# Patient Record
Sex: Male | Born: 1980 | Race: Black or African American | Hispanic: No | Marital: Single | Smoking: Current every day smoker
Health system: Southern US, Community
[De-identification: ages and names within clinical notes are randomized; demographics above are authoritative.]

## PROBLEM LIST (undated history)

## (undated) DIAGNOSIS — J45909 Unspecified asthma, uncomplicated: Secondary | ICD-10-CM

---

## 2007-06-11 ENCOUNTER — Emergency Department (HOSPITAL_COMMUNITY): Admission: EM | Admit: 2007-06-11 | Discharge: 2007-06-11 | Payer: Self-pay | Admitting: Emergency Medicine

## 2007-09-24 ENCOUNTER — Emergency Department (HOSPITAL_COMMUNITY): Admission: EM | Admit: 2007-09-24 | Discharge: 2007-09-24 | Payer: Self-pay | Admitting: Emergency Medicine

## 2008-04-29 ENCOUNTER — Emergency Department (HOSPITAL_COMMUNITY): Admission: EM | Admit: 2008-04-29 | Discharge: 2008-04-29 | Payer: Self-pay | Admitting: Family Medicine

## 2009-02-24 ENCOUNTER — Emergency Department (HOSPITAL_COMMUNITY): Admission: EM | Admit: 2009-02-24 | Discharge: 2009-02-24 | Payer: Self-pay | Admitting: Emergency Medicine

## 2009-04-28 ENCOUNTER — Emergency Department (HOSPITAL_COMMUNITY): Admission: EM | Admit: 2009-04-28 | Discharge: 2009-04-28 | Payer: Self-pay | Admitting: Emergency Medicine

## 2010-05-14 LAB — DIFFERENTIAL
Basophils Relative: 1 % (ref 0–1)
Eosinophils Absolute: 0.1 10*3/uL (ref 0.0–0.7)
Eosinophils Relative: 2 % (ref 0–5)
Lymphs Abs: 2 10*3/uL (ref 0.7–4.0)
Monocytes Absolute: 0.8 10*3/uL (ref 0.1–1.0)
Monocytes Relative: 9 % (ref 3–12)
Neutrophils Relative %: 67 % (ref 43–77)

## 2010-05-14 LAB — POCT I-STAT, CHEM 8
Creatinine, Ser: 0.9 mg/dL (ref 0.4–1.5)
HCT: 48 % (ref 39.0–52.0)
Hemoglobin: 16.3 g/dL (ref 13.0–17.0)
Potassium: 3.4 mEq/L — ABNORMAL LOW (ref 3.5–5.1)
Sodium: 140 mEq/L (ref 135–145)

## 2010-05-14 LAB — BASIC METABOLIC PANEL
Calcium: 9.5 mg/dL (ref 8.4–10.5)
GFR calc Af Amer: >60 mL/min (ref >60)
GFR calc non Af Amer: >60 mL/min (ref >60)
Glucose, Bld: 92 mg/dL (ref 70–99)
Sodium: 140 mEq/L (ref 135–145)

## 2010-05-14 LAB — URINALYSIS, ROUTINE W REFLEX MICROSCOPIC
Ketones, ur: NEGATIVE mg/dL
Protein, ur: NEGATIVE mg/dL
Specific Gravity, Urine: 1.023 (ref 1.005–1.030)
Urobilinogen, UA: 0.2 mg/dL (ref 0.0–1.0)

## 2010-05-14 LAB — RAPID URINE DRUG SCREEN, HOSP PERFORMED
Barbiturates: UNDETERMINED
Cocaine: POSITIVE — AB
Tetrahydrocannabinol: POSITIVE — AB

## 2010-05-14 LAB — CBC
MCHC: 33.9 g/dL (ref 30.0–36.0)
RDW: 13.4 % (ref 11.5–15.5)

## 2010-06-01 LAB — GC/CHLAMYDIA PROBE AMP, GENITAL: GC Probe Amp, Genital: NEGATIVE

## 2010-11-17 LAB — URINE CULTURE

## 2010-11-17 LAB — URINALYSIS, ROUTINE W REFLEX MICROSCOPIC
Bilirubin Urine: NEGATIVE
Ketones, ur: NEGATIVE
Nitrite: NEGATIVE
Protein, ur: NEGATIVE
pH: 6.5

## 2010-11-17 LAB — GC/CHLAMYDIA PROBE AMP, GENITAL
Chlamydia, DNA Probe: POSITIVE — AB
GC Probe Amp, Genital: NEGATIVE

## 2010-11-17 LAB — URINE MICROSCOPIC-ADD ON

## 2012-03-02 ENCOUNTER — Emergency Department (HOSPITAL_COMMUNITY)
Admission: EM | Admit: 2012-03-02 | Discharge: 2012-03-03 | Disposition: A | Discharge status: Home / Self Care | Payer: Self-pay | Attending: Emergency Medicine | Admitting: Emergency Medicine

## 2012-03-02 ENCOUNTER — Encounter (HOSPITAL_COMMUNITY): Payer: Self-pay

## 2012-03-02 DIAGNOSIS — L02519 Cutaneous abscess of unspecified hand: Secondary | ICD-10-CM | POA: Insufficient documentation

## 2012-03-02 DIAGNOSIS — R21 Rash and other nonspecific skin eruption: Secondary | ICD-10-CM | POA: Insufficient documentation

## 2012-03-02 DIAGNOSIS — J45909 Unspecified asthma, uncomplicated: Secondary | ICD-10-CM | POA: Insufficient documentation

## 2012-03-02 DIAGNOSIS — L0291 Cutaneous abscess, unspecified: Secondary | ICD-10-CM

## 2012-03-02 DIAGNOSIS — F17200 Nicotine dependence, unspecified, uncomplicated: Secondary | ICD-10-CM | POA: Insufficient documentation

## 2012-03-02 DIAGNOSIS — L03019 Cellulitis of unspecified finger: Secondary | ICD-10-CM | POA: Insufficient documentation

## 2012-03-02 HISTORY — DX: Unspecified asthma, uncomplicated: J45.909

## 2012-03-02 NOTE — ED Notes (Signed)
Pt reports an abscess to (L) ring finger x10 days. Redness and swelling noted to knuckle, denies injury

## 2012-03-03 MED ORDER — SULFAMETHOXAZOLE-TMP DS 800-160 MG PO TABS: 1.0000 | ORAL_TABLET | Freq: Two times a day (BID) | ORAL | Status: DC

## 2012-03-03 MED ORDER — OXYCODONE-ACETAMINOPHEN 5-325 MG PO TABS: ORAL_TABLET | ORAL | Status: DC
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
## 2012-03-04 NOTE — ED Provider Notes (Signed)
Medical screening examination/treatment/procedure(s) were performed by non-physician practitioner and as supervising physician I was immediately available for consultation/collaboration.   Suzi Roots, MD 03/04/12 [phone]

## 2017-02-06 ENCOUNTER — Emergency Department (HOSPITAL_COMMUNITY): Payer: Self-pay

## 2017-02-06 ENCOUNTER — Encounter (HOSPITAL_COMMUNITY): Payer: Self-pay | Admitting: *Deleted

## 2017-02-06 ENCOUNTER — Other Ambulatory Visit: Payer: Self-pay

## 2017-02-06 ENCOUNTER — Emergency Department (HOSPITAL_COMMUNITY)
Admission: EM | Admit: 2017-02-06 | Discharge: 2017-02-06 | Disposition: A | Discharge status: Home / Self Care | Payer: Self-pay | Attending: Emergency Medicine | Admitting: Emergency Medicine

## 2017-02-06 DIAGNOSIS — W228XXA Striking against or struck by other objects, initial encounter: Secondary | ICD-10-CM | POA: Insufficient documentation

## 2017-02-06 DIAGNOSIS — Y999 Unspecified external cause status: Secondary | ICD-10-CM | POA: Insufficient documentation

## 2017-02-06 DIAGNOSIS — W503XXA Accidental bite by another person, initial encounter: Secondary | ICD-10-CM

## 2017-02-06 DIAGNOSIS — Y9389 Activity, other specified: Secondary | ICD-10-CM | POA: Insufficient documentation

## 2017-02-06 DIAGNOSIS — F17200 Nicotine dependence, unspecified, uncomplicated: Secondary | ICD-10-CM | POA: Insufficient documentation

## 2017-02-06 DIAGNOSIS — Y929 Unspecified place or not applicable: Secondary | ICD-10-CM | POA: Insufficient documentation

## 2017-02-06 DIAGNOSIS — S61452A Open bite of left hand, initial encounter: Secondary | ICD-10-CM | POA: Insufficient documentation

## 2017-02-06 DIAGNOSIS — Z79899 Other long term (current) drug therapy: Secondary | ICD-10-CM | POA: Insufficient documentation

## 2017-02-06 DIAGNOSIS — Z23 Encounter for immunization: Secondary | ICD-10-CM | POA: Insufficient documentation

## 2017-02-06 DIAGNOSIS — S63055A Dislocation of other carpometacarpal joint of left hand, initial encounter: Secondary | ICD-10-CM | POA: Insufficient documentation

## 2017-02-06 DIAGNOSIS — J45909 Unspecified asthma, uncomplicated: Secondary | ICD-10-CM | POA: Insufficient documentation

## 2017-02-06 MED ORDER — MELOXICAM 15 MG PO TABS: 15.0000 mg | ORAL_TABLET | Freq: Every day | ORAL | 0 refills | Status: DC

## 2017-02-06 MED ORDER — AMOXICILLIN-POT CLAVULANATE 875-125 MG PO TABS: 1.0000 | ORAL_TABLET | Freq: Two times a day (BID) | ORAL | 0 refills | Status: DC

## 2017-02-06 MED ORDER — OXYCODONE-ACETAMINOPHEN 5-325 MG PO TABS
1.0000 | ORAL_TABLET | ORAL | Status: DC | PRN
  Administered 2017-02-06: 1 via ORAL
  Filled 2017-02-06: qty 1

## 2017-02-06 MED ORDER — LIDOCAINE HCL (PF) 1 % IJ SOLN
5.0000 mL | Freq: Once | INTRAMUSCULAR | Status: AC
  Administered 2017-02-06: 5 mL via INTRADERMAL
  Filled 2017-02-06: qty 5

## 2017-02-06 MED ORDER — TETANUS-DIPHTH-ACELL PERTUSSIS 5-2.5-18.5 LF-MCG/0.5 IM SUSP
0.5000 mL | Freq: Once | INTRAMUSCULAR | Status: AC
  Administered 2017-02-06: 0.5 mL via INTRAMUSCULAR
  Filled 2017-02-06: qty 0.5

## 2017-02-06 NOTE — ED Notes (Signed)
Ice applied left hand 

## 2017-02-06 NOTE — ED Notes (Signed)
Ortho tech at bedsdide 
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
## 2017-02-06 NOTE — ED Notes (Signed)
Patient transported to X-ray 

## 2017-02-06 NOTE — ED Notes (Signed)
Pt staets he understands instructions. Home stable with steady gait.

## 2017-02-06 NOTE — ED Notes (Signed)
Wound at left 1st finger cleaned and dressed with bacitracin and guaze.

## 2017-02-06 NOTE — Discharge Instructions (Addendum)
The splint should stay in place for 4 -6 weeks. Please follow up Dr Victorino DikeHewitt. Contact a health care provider if: You have chills. You have pain when you move your injured area. You have trouble moving your injured area. You are not improving, or you are getting worse. Get help right away if: You have increasing fluid, blood, or pus coming from your wound. You have increasing redness, swelling, or pain at the site of your wound. You have a red streak extending away from your wound. You have a fever.

## 2017-02-06 NOTE — Progress Notes (Signed)
Orthopedic Tech Progress Note Patient Details:  Roy Nichols 1980-11-20 [id number]  Ortho Devices Type of Ortho Device: Ace wrap, Arm sling, Ulna gutter splint Ortho Device/Splint Location: lue Ortho Device/Splint Interventions: Application   Post Interventions Patient Tolerated: Well Instructions Provided: Care of device   Nikki DomCrawford, Maybelle Depaoli 02/06/2017, 1:15 PM
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
## 2017-05-18 ENCOUNTER — Emergency Department (HOSPITAL_COMMUNITY)
Admission: EM | Admit: 2017-05-18 | Discharge: 2017-05-18 | Disposition: A | Discharge status: Home / Self Care | Payer: Self-pay | Attending: Emergency Medicine | Admitting: Emergency Medicine

## 2017-05-18 DIAGNOSIS — Y999 Unspecified external cause status: Secondary | ICD-10-CM | POA: Insufficient documentation

## 2017-05-18 DIAGNOSIS — Z5321 Procedure and treatment not carried out due to patient leaving prior to being seen by health care provider: Secondary | ICD-10-CM | POA: Insufficient documentation

## 2017-05-18 DIAGNOSIS — W268XXA Contact with other sharp object(s), not elsewhere classified, initial encounter: Secondary | ICD-10-CM | POA: Insufficient documentation

## 2017-05-18 DIAGNOSIS — Y929 Unspecified place or not applicable: Secondary | ICD-10-CM | POA: Insufficient documentation

## 2017-05-18 DIAGNOSIS — S21112A Laceration without foreign body of left front wall of thorax without penetration into thoracic cavity, initial encounter: Secondary | ICD-10-CM | POA: Insufficient documentation

## 2017-05-18 DIAGNOSIS — Y9389 Activity, other specified: Secondary | ICD-10-CM | POA: Insufficient documentation

## 2017-05-18 NOTE — ED Triage Notes (Signed)
Pt to ER for evaluation of left axilla/lateral left thoracic superficial laceration with box cutter, per patient he accidentally did it when he picked up a bag and it had a box cutter sticking through it, it cut him. Pt extremely lethargic at this time. Pt denies using ETOH or drugs at this time. Pulse ox 98%. Pupils +4 equal and reactive. States he has not had much sleep this week.

## 2017-05-18 NOTE — ED Triage Notes (Signed)
[place] x 2 in  [place]

## 2017-05-18 NOTE — ED Notes (Signed)
Pt walked up to counter to inform that he was leaving.

## 2017-06-06 ENCOUNTER — Other Ambulatory Visit: Payer: Self-pay

## 2017-06-06 ENCOUNTER — Emergency Department (HOSPITAL_COMMUNITY)
Admission: EM | Admit: 2017-06-06 | Discharge: 2017-06-07 | Disposition: A | Discharge status: Home / Self Care | Payer: Self-pay | Attending: Emergency Medicine | Admitting: Emergency Medicine

## 2017-06-06 ENCOUNTER — Encounter (HOSPITAL_COMMUNITY): Payer: Self-pay

## 2017-06-06 DIAGNOSIS — F10920 Alcohol use, unspecified with intoxication, uncomplicated: Secondary | ICD-10-CM

## 2017-06-06 DIAGNOSIS — Z046 Encounter for general psychiatric examination, requested by authority: Secondary | ICD-10-CM

## 2017-06-06 DIAGNOSIS — F1220 Cannabis dependence, uncomplicated: Secondary | ICD-10-CM | POA: Insufficient documentation

## 2017-06-06 DIAGNOSIS — F4310 Post-traumatic stress disorder, unspecified: Secondary | ICD-10-CM | POA: Diagnosis present

## 2017-06-06 DIAGNOSIS — Z79899 Other long term (current) drug therapy: Secondary | ICD-10-CM | POA: Insufficient documentation

## 2017-06-06 DIAGNOSIS — J45909 Unspecified asthma, uncomplicated: Secondary | ICD-10-CM | POA: Insufficient documentation

## 2017-06-06 DIAGNOSIS — F332 Major depressive disorder, recurrent severe without psychotic features: Secondary | ICD-10-CM | POA: Insufficient documentation

## 2017-06-06 DIAGNOSIS — R4589 Other symptoms and signs involving emotional state: Secondary | ICD-10-CM | POA: Insufficient documentation

## 2017-06-06 DIAGNOSIS — F17200 Nicotine dependence, unspecified, uncomplicated: Secondary | ICD-10-CM | POA: Insufficient documentation

## 2017-06-06 DIAGNOSIS — F1010 Alcohol abuse, uncomplicated: Secondary | ICD-10-CM | POA: Insufficient documentation

## 2017-06-06 LAB — RAPID URINE DRUG SCREEN, HOSP PERFORMED
Amphetamines: NOT DETECTED
BARBITURATES: NOT DETECTED
Benzodiazepines: NOT DETECTED
Cocaine: NOT DETECTED
OPIATES: NOT DETECTED
TETRAHYDROCANNABINOL: POSITIVE — AB

## 2017-06-06 LAB — BASIC METABOLIC PANEL
ANION GAP: 15 (ref 5–15)
BUN: 11 mg/dL (ref 6–20)
CALCIUM: 9.5 mg/dL (ref 8.9–10.3)
CHLORIDE: 105 mmol/L (ref 101–111)
CO2: 20 mmol/L — AB (ref 22–32)
Creatinine, Ser: 1.09 mg/dL (ref 0.61–1.24)
GFR calc non Af Amer: >60 mL/min (ref >60)
Glucose, Bld: 86 mg/dL (ref 65–99)
POTASSIUM: 3.8 mmol/L (ref 3.5–5.1)
Sodium: 140 mmol/L (ref 135–145)

## 2017-06-06 LAB — ETHANOL: Alcohol, Ethyl (B): 185 mg/dL — ABNORMAL HIGH (ref <10)

## 2017-06-06 LAB — CBC WITH DIFFERENTIAL/PLATELET
BASOS PCT: 0 %
Basophils Absolute: 0.1 10*3/uL (ref 0.0–0.1)
Eosinophils Absolute: 0.4 10*3/uL (ref 0.0–0.7)
Eosinophils Relative: 2 %
HEMATOCRIT: 47 % (ref 39.0–52.0)
HEMOGLOBIN: 16.7 g/dL (ref 13.0–17.0)
LYMPHS PCT: 35 %
Lymphs Abs: 5.2 10*3/uL (ref 0.7–4.0)
MCH: 32.2 pg (ref 26.0–34.0)
MCHC: 35.5 g/dL (ref 30.0–36.0)
MCV: 90.7 fL (ref 78.0–100.0)
Monocytes Absolute: 0.9 10*3/uL (ref 0.1–1.0)
Monocytes Relative: 6 %
NEUTROS ABS: 8.3 10*3/uL (ref 1.7–7.7)
NEUTROS PCT: 57 %
Platelets: 294 10*3/uL (ref 150–400)
RBC: 5.18 MIL/uL (ref 4.22–5.81)
RDW: 13 % (ref 11.5–15.5)
WBC: 14.8 10*3/uL — ABNORMAL HIGH (ref 4.0–10.5)

## 2017-06-06 MED ORDER — ONDANSETRON 4 MG PO TBDP: 4.0000 mg | ORAL_TABLET | Freq: Four times a day (QID) | ORAL | Status: DC | PRN

## 2017-06-06 MED ORDER — LOPERAMIDE HCL 2 MG PO CAPS: 2.0000 mg | ORAL_CAPSULE | ORAL | Status: DC | PRN

## 2017-06-06 MED ORDER — STERILE WATER FOR INJECTION IJ SOLN
INTRAMUSCULAR | Status: AC
  Administered 2017-06-06: 10 mL
  Filled 2017-06-06: qty 10

## 2017-06-06 MED ORDER — LORAZEPAM 1 MG PO TABS: 1.0000 mg | ORAL_TABLET | Freq: Four times a day (QID) | ORAL | Status: DC | PRN

## 2017-06-06 MED ORDER — HYDROXYZINE HCL 25 MG PO TABS
25.0000 mg | ORAL_TABLET | Freq: Four times a day (QID) | ORAL | Status: DC | PRN
  Filled 2017-06-06: qty 1

## 2017-06-06 MED ORDER — CARBAMAZEPINE ER 200 MG PO TB12
200.0000 mg | ORAL_TABLET | Freq: Two times a day (BID) | ORAL | Status: DC
  Filled 2017-06-06: qty 1

## 2017-06-06 MED ORDER — ADULT MULTIVITAMIN W/MINERALS CH: 1.0000 | ORAL_TABLET | Freq: Every day | ORAL | Status: DC

## 2017-06-06 MED ORDER — VITAMIN B-1 100 MG PO TABS: 100.0000 mg | ORAL_TABLET | Freq: Every day | ORAL | Status: DC

## 2017-06-06 MED ORDER — ZIPRASIDONE MESYLATE 20 MG IM SOLR
20.0000 mg | Freq: Once | INTRAMUSCULAR | Status: AC
  Administered 2017-06-06: 20 mg via INTRAMUSCULAR
  Filled 2017-06-06: qty 20

## 2017-06-06 MED ORDER — THIAMINE HCL 100 MG/ML IJ SOLN: 100.0000 mg | Freq: Once | INTRAMUSCULAR | Status: DC

## 2017-06-06 NOTE — ED Notes (Signed)
Patient denies pain and is resting comfortably.  

## 2017-06-06 NOTE — BH Assessment (Signed)
[facility]  Per Juanetta BeetsJacqueline Norman, DO, this pt [facility] at this [facility] for further observation and stabilization.  Pt presents under IVC initiated by pt's mother, which Dr Sharma CovertNorman has upheld.  Pt's nurse has been notified.   Doylene Canninghomas Torris House, [place] Behavioral Health Coordinator [phone]

## 2017-06-06 NOTE — ED Notes (Signed)
[facility] Expected date:  Expected time:  Means of arrival:  Comments: Room 16

## 2017-06-06 NOTE — ED Notes (Signed)
Patient seen sleeping soundly at this time. Will continue to monitor patient.
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
## 2017-06-06 NOTE — BH Assessment (Signed)
Assessment Note  Roy Nichols is an 37 y.o. male.  -Clinician reviewed note by Sharilyn Sites, PA.  37 year old male with history of asthma, presenting to the ED under IVC petition by his mother.  Reportedly patient was threatening her with a knife and losing control from a mental standpoint.  Patient adamantly denies these accusations.  He reports he and his mother got into a verbal altercation over something stupid.  States he left the house for several hours, came back and was trying to go to bed when the police showed up at his door to bring him in.  States he is not exactly sure what happened.  He denies any suicidal homicidal ideation.  No hallucinations.  He is calm and cooperative here.  Admits to smoking weed and drinking a beer earlier this evening.  Patient is agitated about being IVC'ed and brought to [facility].  He admits that he and mother had gotten into an argument.  He says that it was over a very minor thing.  He reports that he left the house for two hours and visited with his pastor and "got a lot of things off my chest."  He says came back and got ready for bed and the police showed up and brought him to [facility].  Patient denies doing anything violent towards his mother.  He denies any SI, HI or A/V hallucinations.  Patient is stressed about working enough to help pay for his 69 year old son's birthday party this Sunday.  Patient says he works but does not get enough work to make ends meet.  He has legal issues currently and is on probation also.    Patient admits to using marijuana daily.   He drinks ETOH about every other day or so.    Patient is frustrated about being in this situation.  He says he is hungry and is stressed about not having enough money.  He said "I need help with getting a better job and being in my own home."  He sees the IVC as a waste of time.  He is worried about missing work in the morning.  Clinician called mother and talked to her.  She said that he has  been charging towards her.  She says that it starts when he is intoxicated.  She says "it is like a temper tantrum."  She said that this behavior happened last week also.  He can also get this way when he is not intoxicated also.  She said that it was bad last week and she had to call the police because he was becoming physically aggressive.  He was trying to get her car keys from her.  Patient was seeing a therapist when he was living in [place] 2-3 years ago.  He witnessed a friend get killed in front of him.  Mother feels he needs psychiatric help to get him control of his emotions.    -Clinician discussed patient care with Donell Sievert, PA.  He recommends patient's IVC be reviewed and 1st Opinion completed.  Diagnosis: F12.20 Cannabis use d/o severe; F10.20 ETOH use d/o moderate; F33.2 MDD recurrent severe  Past Medical History:  Past Medical History:  Diagnosis Date  . Asthma     History reviewed. No pertinent surgical history.  Family History: History reviewed. No pertinent family history.  Social History:  reports that he has been smoking.  He has never used smokeless tobacco. He reports that he drinks alcohol. He reports that he has current  or past drug history. Drug: Marijuana.  Additional Social History:  Alcohol / Drug Use Pain Medications: None Prescriptions: None Over the Counter: None History of alcohol / drug use?: Yes Substance #1 Name of Substance 1: ETOH 1 - Age of First Use: Teens 1 - Amount (size/oz): A beer every few days 1 - Frequency: 2-3x/W 1 - Duration: ongoing 1 - Last Use / Amount: 04/17  BAL was 185 at 02:11 Substance #2 Name of Substance 2: Marijuana 2 - Age of First Use: Teens 2 - Amount (size/oz): "As much as I can get my hands on." 2 - Frequency: Daily 2 - Duration: on-going  2 - Last Use / Amount: 04/17  CIWA: CIWA-Ar BP: 116/60 Pulse Rate: 79 COWS:    Allergies:  Allergies  Allergen Reactions  . Amoxicillin Other (See Comments)     unknown  . Other     Grape and cranberry juice  . Shellfish Allergy     Home Medications:  (Not in a hospital admission)  OB/GYN Status:  No LMP for male patient.  General Assessment Data Location of Assessment: [facility] TTS Assessment: In system Is this a Tele or Face-to-Face Assessment?: Face-to-Face Is this an Initial Assessment or a Re-assessment for this encounter?: Initial Assessment Marital status: Single Is patient pregnant?: No Pregnancy Status: No Living Arrangements: Parent(Lives with mother) Can pt return to current living arrangement?: Yes Admission Status: Involuntary Is patient capable of signing voluntary admission?: No Referral Source: Self/Family/Friend Insurance type: self pay     Crisis Care Plan Living Arrangements: Parent(Lives with mother) Name of Psychiatrist: None Name of Therapist: None  Education Status Is patient currently in school?: No Is the patient employed, unemployed or receiving disability?: Employed(Pt says he works for himself)  Risk to self with the past 6 months Suicidal Ideation: No Has patient been a risk to self within the past 6 months prior to admission? : No Suicidal Intent: No Has patient had any suicidal intent within the past 6 months prior to admission? : No Is patient at risk for suicide?: No Suicidal Plan?: No Has patient had any suicidal plan within the past 6 months prior to admission? : No Access to Means: No What has been your use of drugs/alcohol within the last 12 months?: ETOH & cocaine Previous Attempts/Gestures: No How many times?: 0 Other Self Harm Risks: None Triggers for Past Attempts: None known Intentional Self Injurious Behavior: None Family Suicide History: No Recent stressful life event(s): Conflict (Comment), Financial Problems, Legal Issues(Conflict with mother) Persecutory voices/beliefs?: No Depression: Yes Depression Symptoms: Despondent, Guilt, Feeling angry/irritable Substance abuse  history and/or treatment for substance abuse?: Yes Suicide prevention information given to non-admitted patients: Not applicable  Risk to Others within the past 6 months Homicidal Ideation: No Does patient have any lifetime risk of violence toward others beyond the six months prior to admission? : No Thoughts of Harm to Others: No Current Homicidal Intent: No Current Homicidal Plan: No Access to Homicidal Means: No Identified Victim: No one History of harm to others?: Yes Assessment of Violence: In distant past Violent Behavior Description: When I was in school. Does patient have access to weapons?: No Criminal Charges Pending?: Yes Describe Pending Criminal Charges: Does not want to divulge Does patient have a court date: Yes Court Date: (Does not divulge.) Is patient on probation?: Yes  Psychosis Hallucinations: None noted Delusions: None noted  Mental Status Report Appearance/Hygiene: Unremarkable, In scrubs Eye Contact: Poor Motor Activity: Freedom of movement, Unremarkable Speech:  Logical/coherent Level of Consciousness: Alert, Irritable Mood: Anxious, Angry Affect: Irritable Anxiety Level: Moderate Thought Processes: Relevant, Coherent Judgement: Impaired Orientation: Appropriate for developmental age Obsessive Compulsive Thoughts/Behaviors: None  Cognitive Functioning Concentration: Normal Memory: Remote Intact, Recent Intact Is patient IDD: No Is patient DD?: No Insight: Good Impulse Control: Poor Appetite: Good Have you had any weight changes? : No Change Sleep: No Change Total Hours of Sleep: 8 Vegetative Symptoms: None  ADLScreening [facility] Assessment Services) Patient's cognitive ability adequate to safely complete daily activities?: Yes Patient able to express need for assistance with ADLs?: Yes Independently performs ADLs?: Yes (appropriate for developmental age)  Prior Inpatient Therapy Prior Inpatient Therapy: No  Prior Outpatient  Therapy Prior Outpatient Therapy: No Does patient have an ACCT team?: No Does patient have Intensive In-House Services?  : No Does patient have [facility] services? : No Does patient have P4CC services?: No  ADL Screening (condition at time of admission) Patient's cognitive ability adequate to safely complete daily activities?: Yes Is the patient deaf or have difficulty hearing?: No Does the patient have difficulty seeing, even when wearing glasses/contacts?: No Does the patient have difficulty concentrating, remembering, or making decisions?: No Patient able to express need for assistance with ADLs?: Yes Does the patient have difficulty dressing or bathing?: No Independently performs ADLs?: Yes (appropriate for developmental age) Does the patient have difficulty walking or climbing stairs?: No Weakness of Legs: None Weakness of Arms/Hands: None       Abuse/Neglect Assessment (Assessment to be complete while patient is alone) Abuse/Neglect Assessment Can Be Completed: Yes Physical Abuse: Denies Verbal Abuse: Denies Sexual Abuse: Denies Exploitation of patient/patient's resources: Denies Self-Neglect: Denies     Merchant navy officer (For Healthcare) Does Patient Have a Medical Advance Directive?: No Would patient like information on creating a medical advance directive?: No - Patient declined    Additional Information 1:1 In Past 12 Months?: No CIRT Risk: No Elopement Risk: No Does patient have medical clearance?: Yes     Disposition:  Disposition Initial Assessment Completed for this Encounter: Yes Disposition of Patient: (Pt to be reviewed with PA) Patient refused recommended treatment: No Mode of transportation if patient is discharged?: N/A Patient referred to: Other (Comment)(To be reviewed with PA)  On Site Evaluation by:   Reviewed with Physician:    Alexandria Lodge 06/06/2017 6:07 AM

## 2017-06-06 NOTE — ED Notes (Signed)
Patient refused his bedtime med stated "I'm fine. Nothing is wrong with me". Patient denies pain, SI/HI, AH/VH at this time. Patient believes he's here for "nothing". Will continue to monitor patient.

## 2017-06-06 NOTE — ED Triage Notes (Signed)
Pt here under IVC by his mother, [employer] was at the house earlier because he was threatening with a knife and losing control

## 2017-06-07 DIAGNOSIS — F4310 Post-traumatic stress disorder, unspecified: Secondary | ICD-10-CM

## 2017-06-07 DIAGNOSIS — Z62820 Parent-biological child conflict: Secondary | ICD-10-CM

## 2017-06-07 DIAGNOSIS — F1290 Cannabis use, unspecified, uncomplicated: Secondary | ICD-10-CM

## 2017-06-07 DIAGNOSIS — R454 Irritability and anger: Secondary | ICD-10-CM

## 2017-06-07 DIAGNOSIS — F17210 Nicotine dependence, cigarettes, uncomplicated: Secondary | ICD-10-CM

## 2017-06-07 DIAGNOSIS — F1010 Alcohol abuse, uncomplicated: Secondary | ICD-10-CM

## 2017-06-07 MED ORDER — CARBAMAZEPINE ER 200 MG PO TB12: 200.0000 mg | ORAL_TABLET | Freq: Two times a day (BID) | ORAL | 0 refills | Status: AC

## 2017-06-07 NOTE — Consult Note (Addendum)
[place] [facility] Consult   Reason for Consult:  Altercation with his mother, anger issues Referring Physician:  EDP Patient Identification: Roy Nichols MRN:  [id number] Principal Diagnosis: PTSD (post-traumatic stress disorder) Diagnosis:   Patient Active Problem List   Diagnosis Date Noted  . PTSD (post-traumatic stress disorder) [F43.10] 06/06/2017    Priority: High  . Alcohol abuse [F10.10] 06/06/2017    Priority: High    Total Time spent with patient: 45 minutes  Subjective:   Roy Nichols is a 37 y.o. male patient has stabilized.  HPI:  37 yo male who was IVC'd by his mother after a verbal altercation after drinking.  He denies suicidal/homicidal ideations, hallucinations, and withdrawal symptoms.  His mother would like him to get help for his anger due to his PTSD from seeing a friend of his killed a few years ago.  He was initially agitated when he first came but has been calm and cooperative for 24 hours.  Diezel did talk to his mother and knows he cannot return to live with her but does have a place to live.  He, unfortunately, does not want to get counseling or take medications.  Zidan just wants to continue taking his "herbs."  Past Psychiatric History: anger issues, substance abuse  Risk to Self: None Risk to [facility] Prior Inpatient Therapy: Prior Inpatient Therapy: No Prior Outpatient Therapy: Prior Outpatient Therapy: No Does patient have an ACCT team?: No Does patient have Intensive In-House Services?  : No Does patient have [facility] services? : No Does patient have P4CC services?: No  Past Medical History:  Past Medical History:  Diagnosis Date  . Asthma    History reviewed. No pertinent surgical history. Family History: History reviewed. No pertinent family history. Family Psychiatric  History: none Social History:  Social History   Substance and Sexual Activity  Alcohol Use Yes     Social History   Substance and Sexual  Activity  Drug Use Yes  . Types: Marijuana    Social History   Socioeconomic History  . Marital status: Single    Spouse name: Not on file  . Number of children: Not on file  . Years of education: Not on file  . Highest education level: Not on file  Occupational History  . Not on file  Social Needs  . Financial resource strain: Not on file  . Food insecurity:    Worry: Not on file    Inability: Not on file  . Transportation needs:    Medical: Not on file    Non-medical: Not on file  Tobacco Use  . Smoking status: Current Every Day Smoker  . Smokeless tobacco: Never Used  Substance and Sexual Activity  . Alcohol use: Yes  . Drug use: Yes    Types: Marijuana  . Sexual activity: Not on file  Lifestyle  . Physical activity:    Days per week: Not on file    Minutes per session: Not on file  . Stress: Not on file  Relationships  . Social connections:    Talks on phone: Not on file    Gets together: Not on file    Attends religious service: Not on file    Active member of club or organization: Not on file    Attends meetings of clubs or organizations: Not on file    Relationship status: Not on file  Other Topics Concern  . Not on file  Social History Narrative  . Not on file  Additional Social History: N/A    Allergies:   Allergies  Allergen Reactions  . Amoxicillin Other (See Comments)    unknown  . Other     Grape and cranberry juice  . Shellfish Allergy     Labs:  Results for orders placed or performed during the hospital encounter of 06/06/17 (from the past 48 hour(s))  Rapid urine drug screen (hospital performed)     Status: Abnormal   Collection Time: 06/06/17  2:11 AM  Result Value Ref Range   Opiates NONE DETECTED NONE DETECTED   Cocaine NONE DETECTED NONE DETECTED   Benzodiazepines NONE DETECTED NONE DETECTED   Amphetamines NONE DETECTED NONE DETECTED   Tetrahydrocannabinol POSITIVE (A) NONE DETECTED   Barbiturates NONE DETECTED NONE DETECTED     Comment: (NOTE) DRUG SCREEN FOR MEDICAL PURPOSES ONLY.  IF CONFIRMATION IS NEEDED FOR ANY PURPOSE, NOTIFY LAB WITHIN 5 DAYS. LOWEST DETECTABLE LIMITS FOR URINE DRUG SCREEN Drug Class                     Cutoff (ng/mL) Amphetamine and metabolites    1000 Barbiturate and metabolites    200 Benzodiazepine                 373 Tricyclics and metabolites     300 Opiates and metabolites        300 Cocaine and metabolites        300 THC                            50 Performed at [facility], [place] [address], [place], [place] [address]   CBC with Differential     Status: Abnormal   Collection Time: 06/06/17  2:11 AM  Result Value Ref Range   WBC 14.8 (H) 4.0 - 10.5 K/uL   RBC 5.18 4.22 - 5.81 MIL/uL   Hemoglobin 16.7 13.0 - 17.0 g/dL   HCT 47.0 39.0 - 52.0 %   MCV 90.7 78.0 - 100.0 fL   MCH 32.2 26.0 - 34.0 pg   MCHC 35.5 30.0 - 36.0 g/dL   RDW 13.0 11.5 - 15.5 %   Platelets 294 150 - 400 K/uL   Neutrophils Relative % 57 %   Neutro Abs 8.3 1.7 - 7.7 K/uL   Lymphocytes Relative 35 %   Lymphs Abs 5.2 0.7 - 4.0 K/uL   Monocytes Relative 6 %   Monocytes Absolute 0.9 0.1 - 1.0 K/uL   Eosinophils Relative 2 %   Eosinophils Absolute 0.4 0.0 - 0.7 K/uL   Basophils Relative 0 %   Basophils Absolute 0.1 0.0 - 0.1 K/uL    Comment: Performed at [facility], [place] [address], [place], [place] [address]  Basic metabolic panel     Status: Abnormal   Collection Time: 06/06/17  2:11 AM  Result Value Ref Range   Sodium 140 135 - 145 mmol/L   Potassium 3.8 3.5 - 5.1 mmol/L   Chloride 105 101 - 111 mmol/L   CO2 20 (L) 22 - 32 mmol/L   Glucose, Bld 86 65 - 99 mg/dL   BUN 11 6 - 20 mg/dL   Creatinine, Ser 1.09 0.61 - 1.24 mg/dL   Calcium 9.5 8.9 - 10.3 mg/dL   GFR calc non Af Amer >60 >60 mL/min   GFR calc Af Amer >60 >60 mL/min    Comment: (NOTE) The eGFR has been calculated using  the CKD EPI equation. This calculation has not been validated in all  clinical situations. eGFR's persistently <60 mL/min signify possible Chronic Kidney Disease.    Anion gap 15 5 - 15    Comment: Performed at [facility], [place] [address], [place], [place] [address]  Ethanol     Status: Abnormal   Collection Time: 06/06/17  2:11 AM  Result Value Ref Range   Alcohol, Ethyl (B) 185 (H) <10 mg/dL    Comment:        LOWEST DETECTABLE LIMIT FOR SERUM ALCOHOL IS 10 mg/dL FOR MEDICAL PURPOSES ONLY Performed at [facility] [address] [place], [place] [address]     Current Facility-Administered Medications  Medication Dose Route Frequency Provider Last Rate Last Dose  . carbamazepine (TEGRETOL XR) 12 hr tablet 200 mg  200 mg Oral BID Patrecia Pour, NP      . hydrOXYzine (ATARAX/VISTARIL) tablet 25 mg  25 mg Oral Q6H PRN Faythe Dingwall, DO      . loperamide (IMODIUM) capsule 2-4 mg  2-4 mg Oral PRN Faythe Dingwall, DO      . multivitamin with minerals tablet 1 tablet  1 tablet Oral Daily Faythe Dingwall, DO      . ondansetron (ZOFRAN-ODT) disintegrating tablet 4 mg  4 mg Oral Q6H PRN Faythe Dingwall, DO      . thiamine (VITAMIN B-1) tablet 100 mg  100 mg Oral Daily Faythe Dingwall, DO       Current Outpatient Medications  Medication Sig Dispense Refill  . amoxicillin-clavulanate (AUGMENTIN) 875-125 MG tablet Take 1 tablet by mouth 2 (two) times daily. One po bid x 7 days (Patient not taking: Reported on 06/06/2017) 14 tablet 0  . meloxicam (MOBIC) 15 MG tablet Take 1 tablet (15 mg total) by mouth daily. Take 1 daily with food. (Patient not taking: Reported on 06/06/2017) 10 tablet 0  . oxyCODONE-acetaminophen (PERCOCET/ROXICET) 5-325 MG per tablet 1 to 2 tabs PO q6hrs  PRN for pain (Patient not taking: Reported on 06/06/2017) 15 tablet 0  . sulfamethoxazole-trimethoprim (BACTRIM DS) 800-160 MG per tablet Take 1 tablet by mouth 2 (two) times daily. (Patient not taking: Reported on 06/06/2017) 14  tablet 0    Musculoskeletal: Strength & Muscle Tone: within normal limits Gait & Station: normal Patient leans: N/A  Psychiatric Specialty Exam: Physical Exam  Nursing note and vitals reviewed. Constitutional: He is oriented to person, place, and time. He appears well-developed and well-nourished.  HENT:  Head: Normocephalic and atraumatic.  Neck: Normal range of motion.  Respiratory: Effort normal.  Musculoskeletal: Normal range of motion.  Neurological: He is alert and oriented to person, place, and time.  Psychiatric: He has a normal mood and affect. His speech is normal and behavior is normal. Judgment and thought content normal. Cognition and memory are normal.    Review of Systems  Psychiatric/Behavioral: Positive for substance abuse.  All other systems reviewed and are negative.   Blood pressure 118/76, pulse 67, temperature 98.1 F (36.7 C), temperature source Oral, resp. rate 16, SpO2 100 %.There is no height or weight on file to calculate BMI.  General Appearance: Casual  Eye Contact:  Good  Speech:  Normal Rate  Volume:  Normal  Mood:  Euthymic  Affect:  Congruent  Thought Process:  Coherent and Descriptions of Associations: Intact  Orientation:  Full (Time, Place, and Person)  Thought Content:  WDL and Logical  Suicidal Thoughts:  No  Homicidal Thoughts:  No  Memory:  Immediate;   Good Recent;   Good Remote;   Good  Judgement:  Fair  Insight:  Fair  Psychomotor Activity:  Normal  Concentration:  Concentration: Good and Attention Span: Good  Recall:  Good  Fund of Knowledge:  Good  Language:  Good  Akathisia:  No  Handed:  Right  AIMS (if indicated):   N/A  Assets:  Leisure Time Physical Health Resilience Social Support  ADL's:  Intact  Cognition:  WNL  Sleep:   N/A     Treatment Plan Summary: Daily contact with patient to assess and evaluate symptoms and progress in treatment, Medication management and Plan PTSD:  -Crisis  stabilization -Medication management:  Started Tegretol 200 mg BID for mood stabilization, PRN agitation medications on arrival once but declines to continue medication as outpatient.  -Individual counseling  Disposition: No evidence of imminent risk to self or others at present.    Waylan Boga, NP 06/07/2017 10:12 AM   Patient seen face-to-face for psychiatric evaluation, chart reviewed and case discussed with the physician extender and developed treatment plan. Reviewed the information documented and agree with the treatment plan.  Buford Dresser, DO 06/07/17 1:22 PM

## 2017-06-07 NOTE — ED Notes (Signed)
Patient denies pain and is resting comfortably.  

## 2017-06-07 NOTE — [facility] Suicide Risk Assessment (Signed)
Suicide Risk Assessment  Discharge Assessment   [facility]   Principal Problem: PTSD (post-traumatic stress disorder) Discharge Diagnoses:  Patient Active Problem List   Diagnosis Date Noted  . PTSD (post-traumatic stress disorder) [F43.10] 06/06/2017    Priority: High  . Alcohol abuse [F10.10] 06/06/2017    Priority: High    Total Time spent with patient: 45 minutes   Musculoskeletal: Strength & Muscle Tone: within normal limits Gait & Station: normal Patient leans: N/A  Psychiatric Specialty Exam: Physical Exam  Constitutional: He is oriented to person, place, and time. He appears well-developed and well-nourished.  HENT:  Head: Normocephalic.  Neck: Normal range of motion.  Respiratory: Effort normal.  Musculoskeletal: Normal range of motion.  Neurological: He is alert and oriented to person, place, and time.  Psychiatric: He has a normal mood and affect. His speech is normal and behavior is normal. Judgment and thought content normal. Cognition and memory are normal.    Review of Systems  Psychiatric/Behavioral: Positive for substance abuse.  All other systems reviewed and are negative.   Blood pressure 118/76, pulse 67, temperature 98.1 F (36.7 C), temperature source Oral, resp. rate 16, SpO2 100 %.There is no height or weight on file to calculate BMI.  General Appearance: Casual  Eye Contact:  Good  Speech:  Normal Rate  Volume:  Normal  Mood:  Euthymic  Affect:  Congruent  Thought Process:  Coherent and Descriptions of Associations: Intact  Orientation:  Full (Time, Place, and Person)  Thought Content:  WDL and Logical  Suicidal Thoughts:  No  Homicidal Thoughts:  No  Memory:  Immediate;   Good Recent;   Good Remote;   Good  Judgement:  Fair  Insight:  Fair  Psychomotor Activity:  Normal  Concentration:  Concentration: Good and Attention Span: Good  Recall:  Good  Fund of Knowledge:  Good  Language:  Good  Akathisia:  No   Handed:  Right  AIMS (if indicated):     Assets:  Leisure Time Physical Health Resilience Social Support  ADL's:  Intact  Cognition:  WNL  Sleep:      Mental Status Per Nursing Assessment::   On Admission:   agitation, verbal altercation with his mother  Demographic Factors:  Male  Loss Factors: NA  Historical Factors: NA  Risk Reduction Factors:   Sense of responsibility to family, Living with another person, especially a relative and Positive social support  Continued Clinical Symptoms:  None   Cognitive Features That Contribute To Risk:  None    Suicide Risk:  Minimal: No identifiable suicidal ideation.  Patients presenting with no risk factors but with morbid ruminations; may be classified as minimal risk based on the severity of the depressive symptoms    Plan Of Care/Follow-up recommendations:  Activity:  as tolerated Diet:  heart healthy diet  Merin Borjon, NP 06/07/2017, 11:42 AM

## 2018-03-14 ENCOUNTER — Encounter (HOSPITAL_COMMUNITY): Payer: Self-pay

## 2018-03-14 ENCOUNTER — Ambulatory Visit (HOSPITAL_COMMUNITY)
Admission: EM | Admit: 2018-03-14 | Discharge: 2018-03-14 | Disposition: A | Discharge status: Home / Self Care | Payer: Self-pay | Attending: Family Medicine | Admitting: Family Medicine

## 2018-03-14 DIAGNOSIS — S0501XA Injury of conjunctiva and corneal abrasion without foreign body, right eye, initial encounter: Secondary | ICD-10-CM

## 2018-03-14 DIAGNOSIS — H10211 Acute toxic conjunctivitis, right eye: Secondary | ICD-10-CM | POA: Insufficient documentation

## 2018-03-14 MED ORDER — FLUORESCEIN SODIUM 1 MG OP STRP
ORAL_STRIP | OPHTHALMIC | Status: AC
  Filled 2018-03-14: qty 1

## 2018-03-14 MED ORDER — NEOMYCIN-POLYMYXIN-DEXAMETH 3.5-10000-0.1 OP SUSP: 2.0000 [drp] | Freq: Four times a day (QID) | OPHTHALMIC | 0 refills | Status: AC

## 2018-03-14 NOTE — Discharge Instructions (Addendum)
Use eye drops 2 - 3 times a day for a week Cool compresses to eye See EYE doctor if not better by Monday Dt Patel [phone] GO TO ER if you have trouble over the weekend

## 2018-03-14 NOTE — ED Provider Notes (Signed)
[facility]    CSN: [id number] Arrival date & time: 03/14/18  1407     History   Chief Complaint Chief Complaint  Patient presents with  . Eye Problem    HPI Roy Nichols is a 38 y.o. male.   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  He states that he was working on the floor today.  He was managing polyurethane.  He thinks that he got some on his finger and then rubbed his eye, or some splashed in his eye and then he rubbed it, in any event he has irritation in his right eye.  He states that he went from his job to his mother's house and had her rinse it thoroughly.  He still has eye irritation so he is here for evaluation.  Past Medical History:  Diagnosis Date  . Asthma     Patient Active Problem List   Diagnosis Date Noted  . PTSD (post-traumatic stress disorder) 06/06/2017  . Alcohol abuse 06/06/2017    History reviewed. No pertinent surgical history.     Home Medications    Prior to Admission medications   Medication Sig Start Date End Date Taking? Authorizing Provider  carbamazepine (TEGRETOL XR) 200 MG 12 hr tablet Take 1 tablet (200 mg total) by mouth 2 (two) times daily. 06/07/17   Charm RingsLord, Jamison Y, NP  neomycin-polymyxin b-dexamethasone (MAXITROL) 3.5-10000-0.1 SUSP Place 2 drops into the right eye every 6 (six) hours. 03/14/18   Eustace MooreNelson, Enslee Bibbins Sue, MD    Family History Family History  Family history unknown: Yes    Social History Social History   Tobacco Use  . Smoking status: Current Every Day Smoker  . Smokeless tobacco: Never Used  Substance Use Topics  . Alcohol use: Yes  . Drug use: Yes    Types: Marijuana     Allergies   Amoxicillin; Other; and Shellfish allergy   Review of Systems Review of Systems  Constitutional: Negative for chills and fever.  HENT: Negative for ear pain and sore throat.   Eyes: Positive for pain, redness and itching. Negative for visual disturbance.  Respiratory: Negative for cough and  shortness of breath.   Cardiovascular: Negative for chest pain and palpitations.  Gastrointestinal: Negative for abdominal pain and vomiting.  Genitourinary: Negative for dysuria and hematuria.  Musculoskeletal: Negative for arthralgias and back pain.  Skin: Negative for color change and rash.  Neurological: Negative for seizures and syncope.  All other systems reviewed and are negative.    Physical Exam Triage Vital Signs ED Triage Vitals  Enc Vitals Group     BP 03/14/18 1517 129/76     Pulse Rate 03/14/18 1517 64     Resp 03/14/18 1517 18     Temp 03/14/18 1517 98.9 F (37.2 C)     Temp Source 03/14/18 1517 Oral     SpO2 03/14/18 1517 97 %     Weight --      Height --      Head Circumference --      Peak Flow --      Pain Score 03/14/18 1518 4     Pain Loc --      Pain Edu? --      Excl. in GC? --    No data found.  Updated Vital Signs BP 129/76 (BP Location: Right Arm)   Pulse 64   Temp 98.9 F (37.2 C) (Oral)   Resp 18   SpO2 97%   Visual  Acuity Right Eye Distance: (S) 20/30 w/o glasses Left Eye Distance: (S) 20/40 w/o glasses Bilateral Distance: (S) 20/25 w/o glasses      Physical Exam Constitutional:      General: He is not in acute distress.    Appearance: He is well-developed.  HENT:     Head: Normocephalic and atraumatic.  Eyes:     General: Lids are normal. Lids are everted, no foreign bodies appreciated.     Conjunctiva/sclera:     Right eye: Right conjunctiva is injected. Chemosis present.     Pupils: Pupils are equal, round, and reactive to light.     Right eye: Corneal abrasion and fluorescein uptake present.   Neck:     Musculoskeletal: Normal range of motion.  Cardiovascular:     Rate and Rhythm: Normal rate.  Pulmonary:     Effort: Pulmonary effort is normal. No respiratory distress.  Abdominal:     General: There is no distension.     Palpations: Abdomen is soft.  Musculoskeletal: Normal range of motion.  Skin:    General:  Skin is warm and dry.  Neurological:     Mental Status: He is alert.      UC Treatments / Results  Labs (all labs ordered are listed, but only abnormal results are displayed) Labs Reviewed - No data to display  EKG None  Radiology No results found.  Procedures Procedures (including critical care time)  Medications Ordered in UC Medications - No data to display  Initial Impression / Assessment and Plan / UC Course  I have reviewed the triage vital signs and the nursing notes.  Pertinent labs & imaging results that were available during my care of the patient were reviewed by me and considered in my medical decision making (see chart for details).    I called ophthalmology and call.  Discussed mechanism of injury and findings.  He recommends an antibiotic eyedrop with dexamethasone such as Maxitrol.  Follow-up if not improved by Monday.  Go to ER if worse instead of better. Final Clinical Impressions(s) / UC Diagnoses   Final diagnoses:  Cornea abrasion, right, initial encounter  Chemical conjunctivitis of right eye     Discharge Instructions     Use eye drops 2 - 3 times a day for a week Cool compresses to eye See EYE doctor if not better by Monday Dt Patel [phone] GO TO ER if you have trouble over the weekend   ED Prescriptions    Medication Sig Dispense Auth. Provider   neomycin-polymyxin b-dexamethasone (MAXITROL) 3.5-10000-0.1 SUSP Place 2 drops into the right eye every 6 (six) hours. 5 mL Eustace MooreNelson, Dontavia Brand Sue, MD     Controlled Substance Prescriptions [place] Controlled Substance Registry consulted? Not Applicable   Eustace MooreNelson, Costella Schwarz Sue, MD 03/14/18 2038

## 2018-03-14 NOTE — ED Triage Notes (Signed)
Pt presents with needing to get his eyes checked after he got chemicals from work in them.

## 2018-06-08 IMAGING — CR DG HAND COMPLETE 3+V*L*
3 series · 3 of 3 positions shown · non-contrast
Comparison: None.

CLINICAL DATA: Punching injury last night.  Pain and swelling.

EXAM:
LEFT HAND - COMPLETE 3+ VIEW

[hand pa]
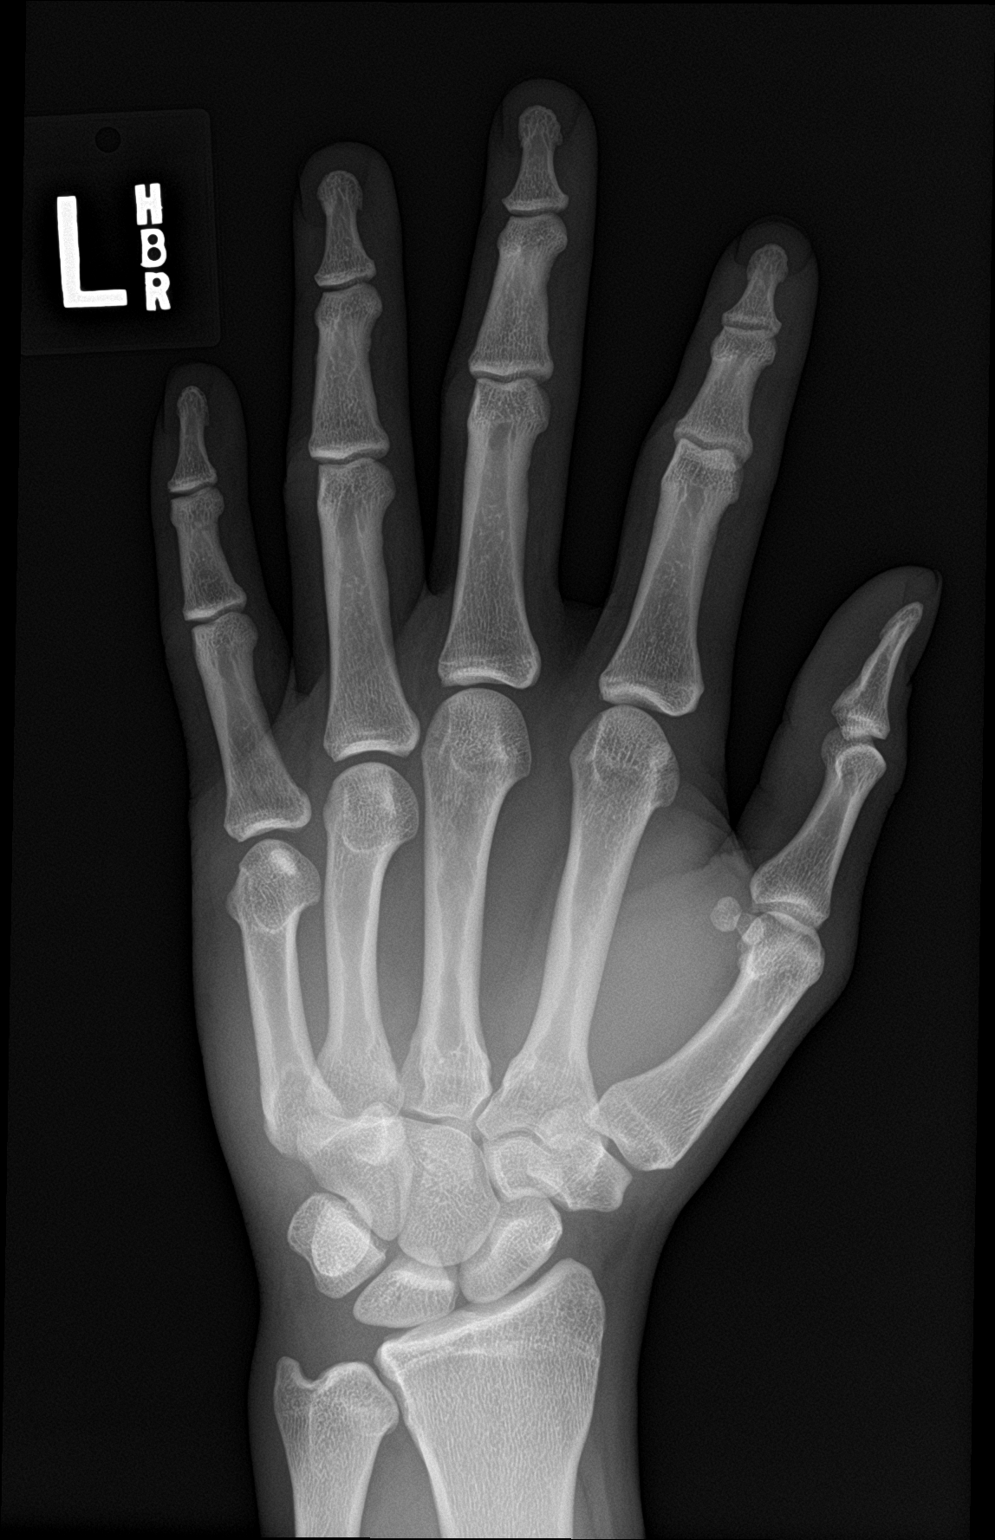

[hand obl]
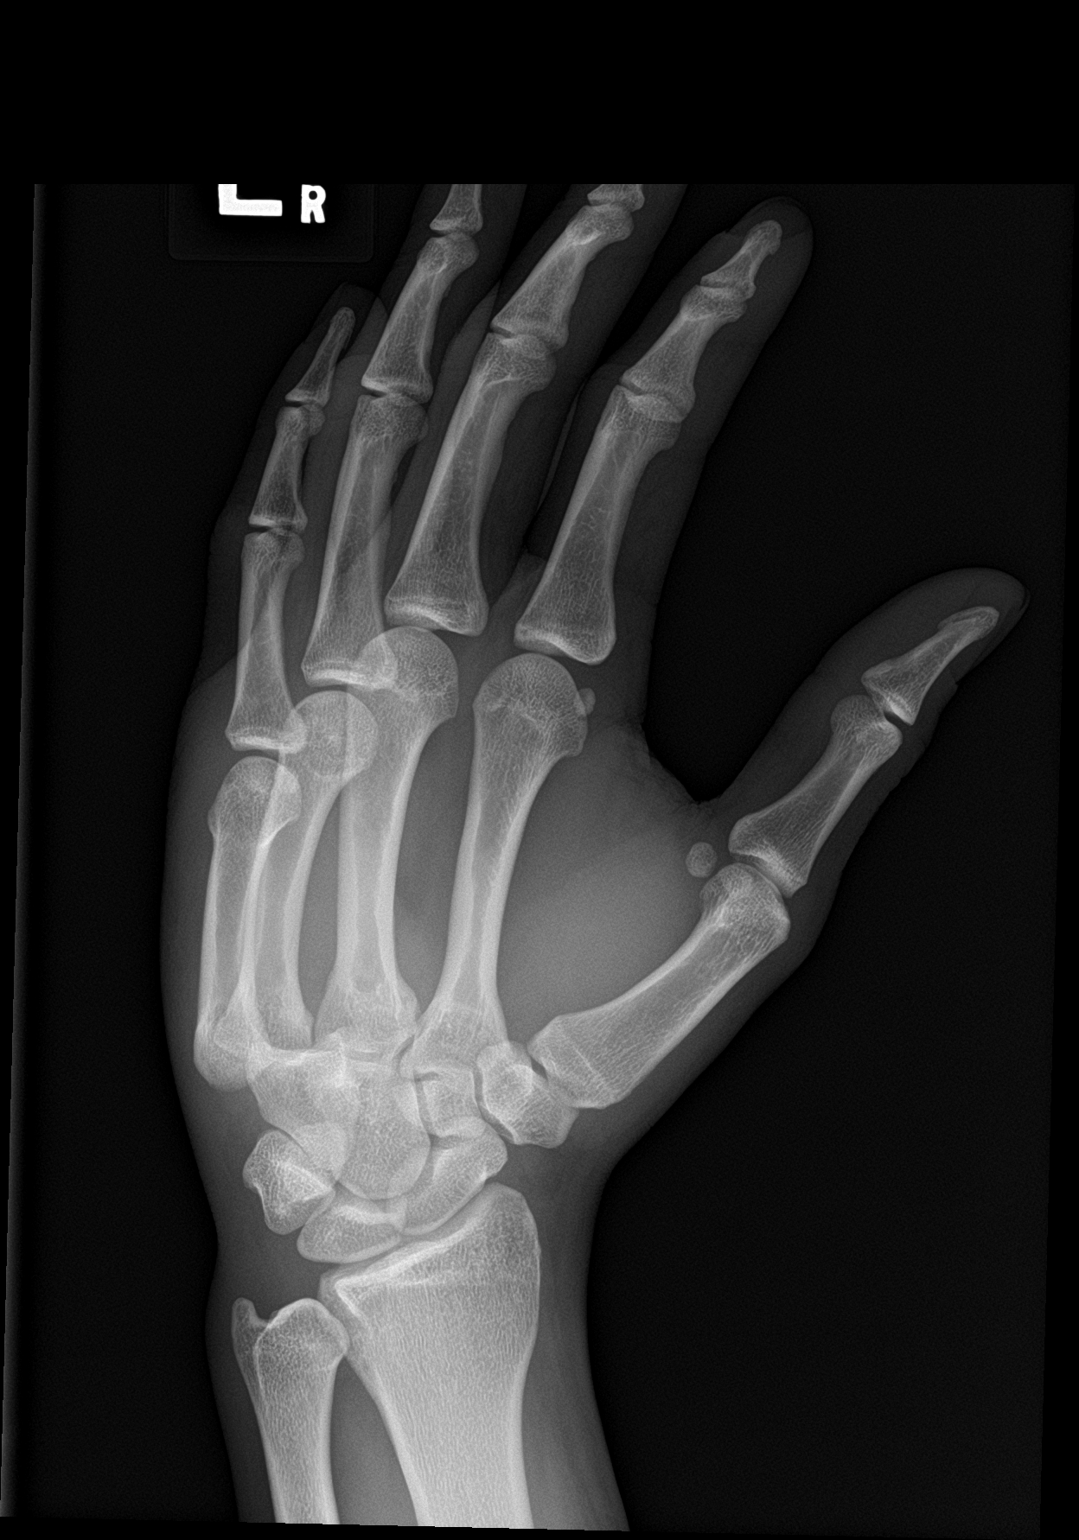

[hand lat]
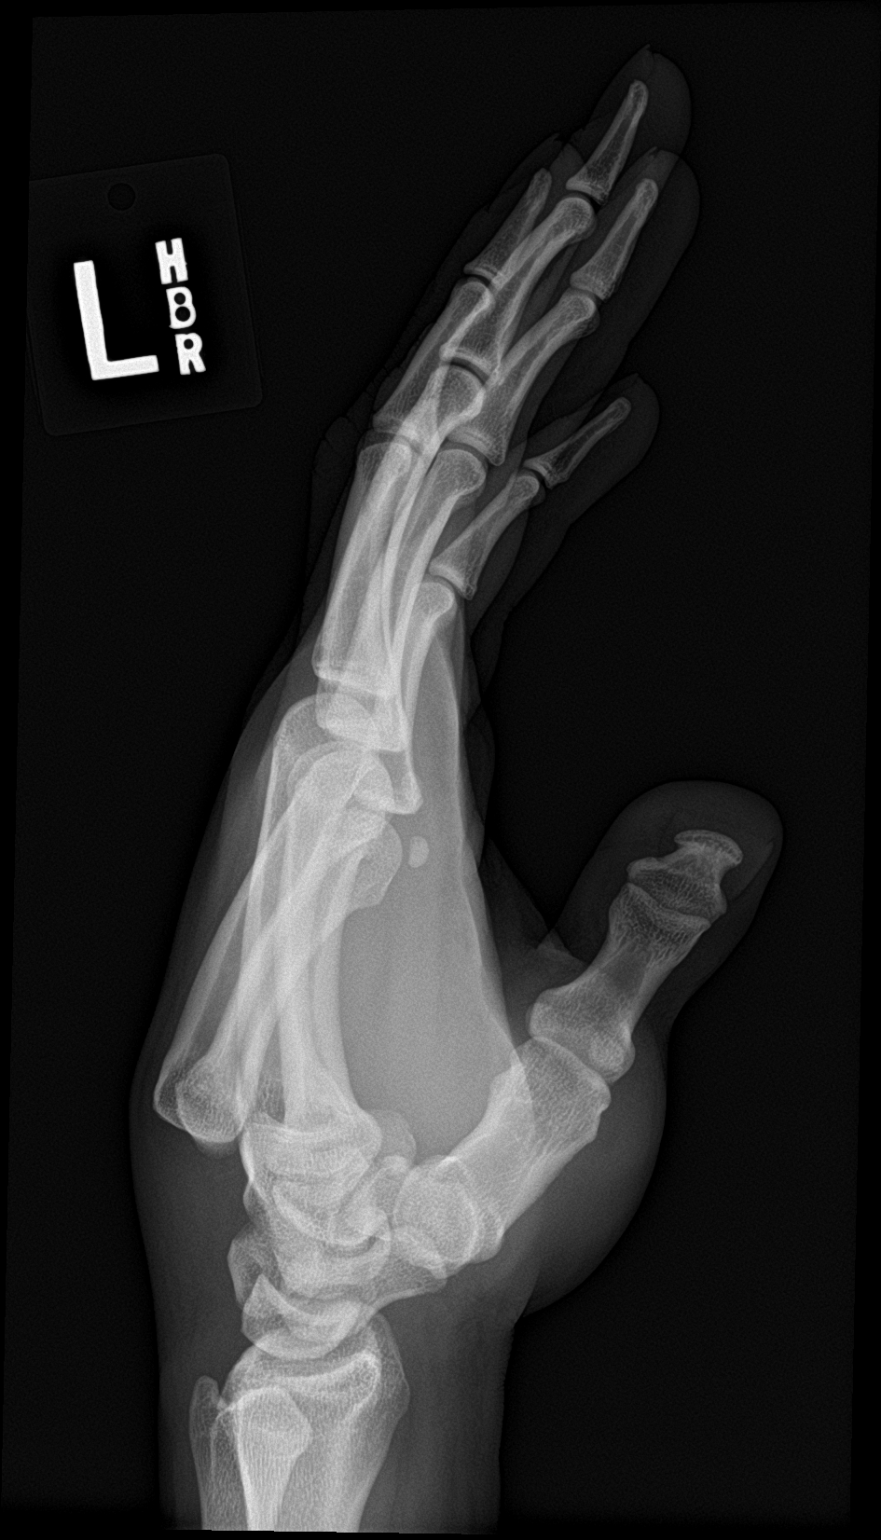

[3 of 3 positions shown; findings below may reference images not displayed]

FINDINGS: Fourth and fifth metacarpals are dislocated dorsally. No visible
fracture.
IMPRESSION: Dorsal dislocation of the fourth and fifth metacarpals at the
carpal/metacarpal articulation.

## 2018-06-08 IMAGING — DX DG HAND 2V*L*
2 series · 2 of 2 positions shown · non-contrast
Comparison: 02/06/2017

CLINICAL DATA: Postreduction

EXAM:
LEFT HAND - 2 VIEW

[hand pa]
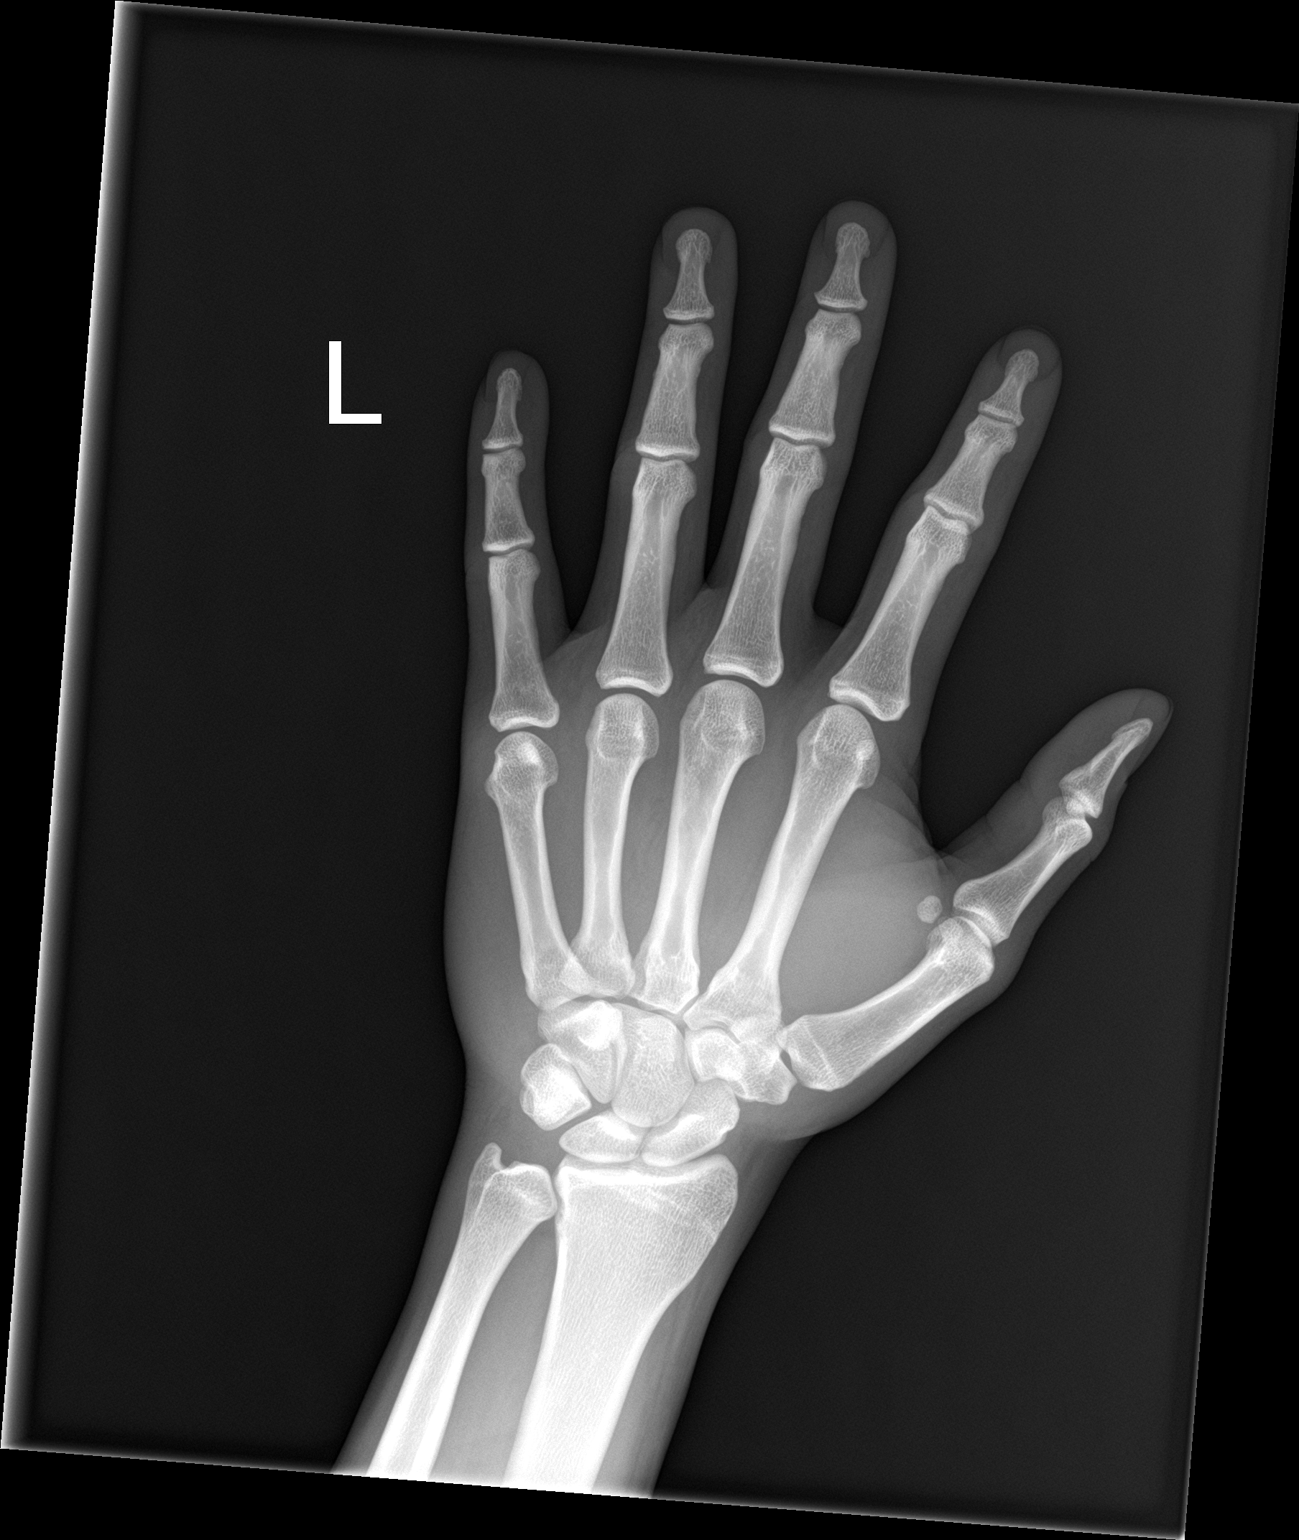

[hand lat]
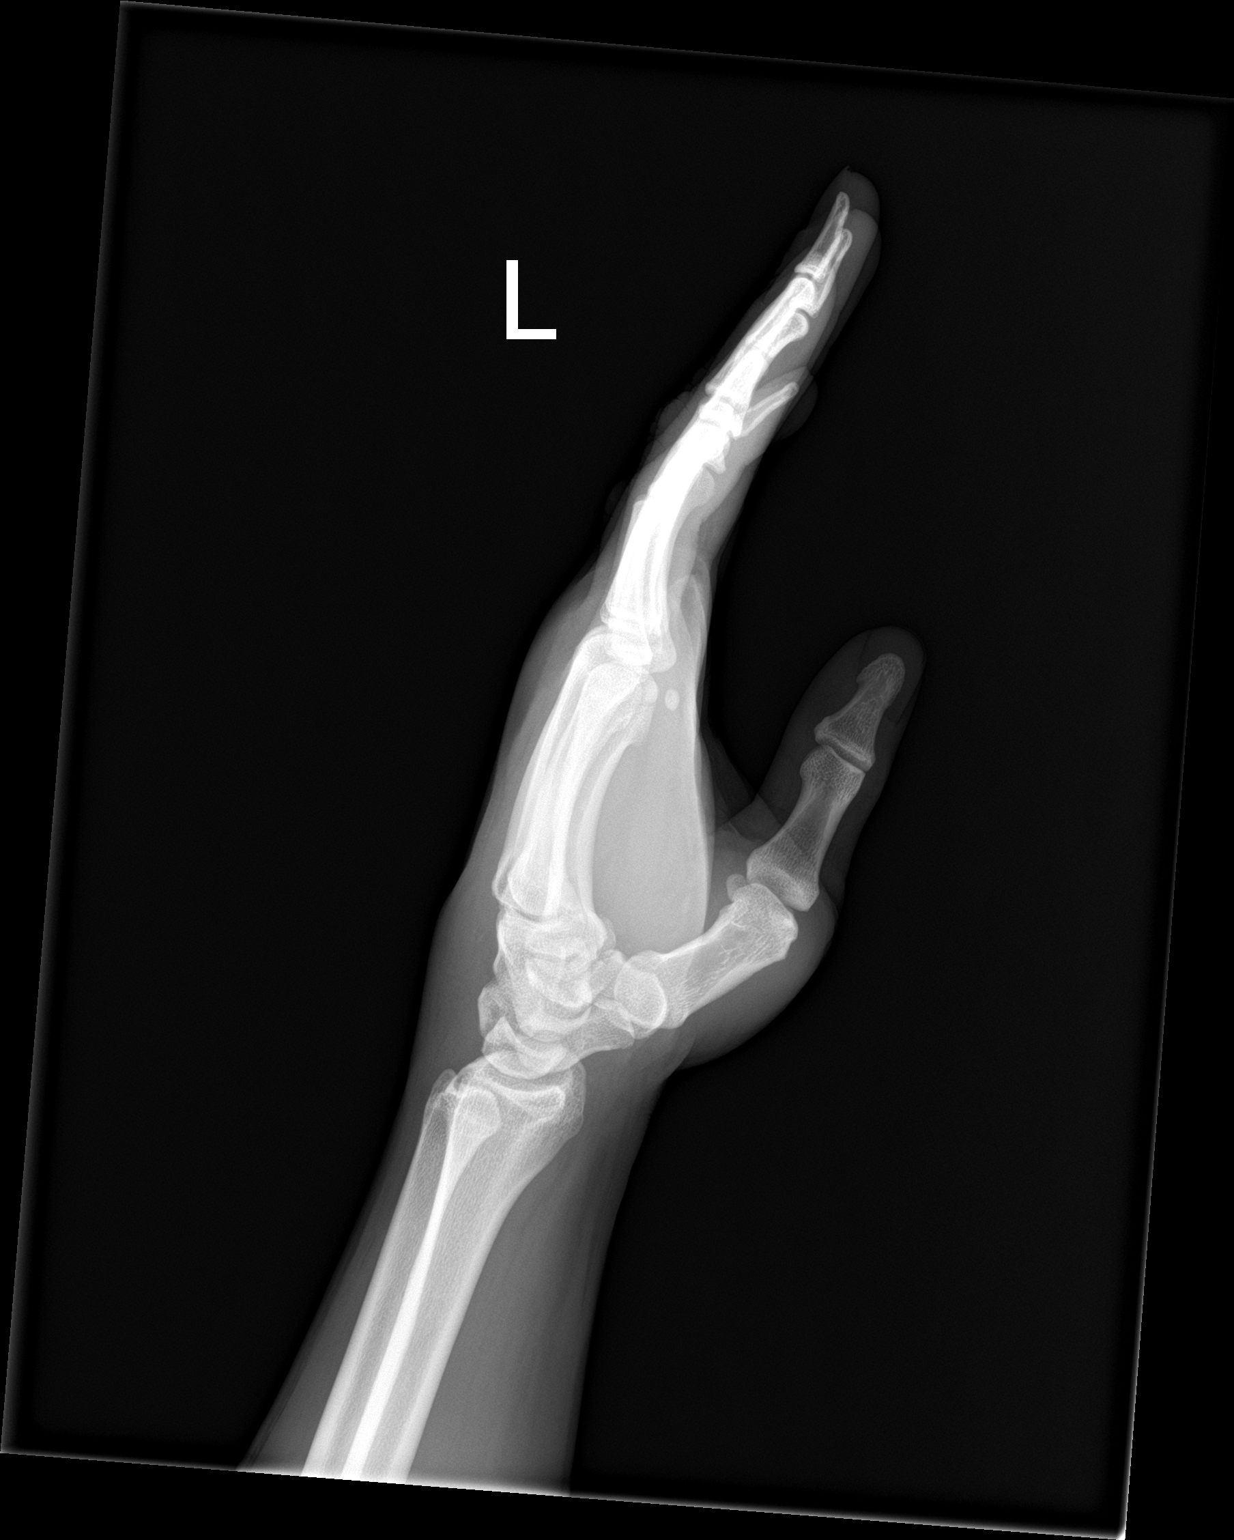

[2 of 2 positions shown; findings below may reference images not displayed]

FINDINGS: Interval reduction of the previously seen dislocated fourth and
fifth metacarpals at the carpometacarpal joints. Posterior soft
tissue swelling noted. No visible fracture.
IMPRESSION: Interval reduction at the fourth and fifth carpometacarpal joints.
No visible fracture.

## 2018-10-14 ENCOUNTER — Other Ambulatory Visit: Payer: Self-pay

## 2018-10-14 ENCOUNTER — Encounter (HOSPITAL_COMMUNITY): Payer: Self-pay

## 2018-10-14 ENCOUNTER — Ambulatory Visit (HOSPITAL_COMMUNITY)
Admission: EM | Admit: 2018-10-14 | Discharge: 2018-10-14 | Disposition: A | Discharge status: Home / Self Care | Payer: BC Managed Care – PPO | Attending: Emergency Medicine | Admitting: Emergency Medicine

## 2018-10-14 DIAGNOSIS — M545 Low back pain, unspecified: Secondary | ICD-10-CM

## 2018-10-14 MED ORDER — IBUPROFEN 800 MG PO TABS: 800.0000 mg | ORAL_TABLET | Freq: Three times a day (TID) | ORAL | 0 refills | Status: AC | PRN

## 2018-10-14 MED ORDER — CYCLOBENZAPRINE HCL 10 MG PO TABS: 10.0000 mg | ORAL_TABLET | Freq: Two times a day (BID) | ORAL | 0 refills | Status: AC | PRN

## 2018-10-14 NOTE — ED Triage Notes (Signed)
Pt presents to UC with lower left back pain since 3-4 days. Pt reports lifting something heavy at work. Pt denies taking any OTC meds for pain. Pt states his whole back "spasms" when standing up.

## 2018-10-14 NOTE — ED Provider Notes (Signed)
[facility]    CSN: [id number] Arrival date & time: 10/14/18  0906      History   Chief Complaint Chief Complaint  Patient presents with  . Back Pain    HPI Roy Nichols is a 38 y.o. male.   Patient presents with left lower back pain.   He describes the pain as a "muscle spasm";  8/10 when pain is at it's worst; worse with bending and lifting; improves with rest.  He denies falls or injury.  He denies paresthesias, saddle anesthesia, bowel or bladder incontinence, or weakness in his LE.  He denies history of prior back pain.  He has a history of PTSD and alcohol abuse.  The history is provided by the patient.    Past Medical History:  Diagnosis Date  . Asthma     Patient Active Problem List   Diagnosis Date Noted  . PTSD (post-traumatic stress disorder) 06/06/2017  . Alcohol abuse 06/06/2017    History reviewed. No pertinent surgical history.     Home Medications    Prior to Admission medications   Medication Sig Start Date End Date Taking? Authorizing Provider  carbamazepine (TEGRETOL XR) 200 MG 12 hr tablet Take 1 tablet (200 mg total) by mouth 2 (two) times daily. 06/07/17   Patrecia Pour, NP  cyclobenzaprine (FLEXERIL) 10 MG tablet Take 1 tablet (10 mg total) by mouth 2 (two) times daily as needed for muscle spasms. 10/14/18   Sharion Balloon, NP  ibuprofen (ADVIL) 800 MG tablet Take 1 tablet (800 mg total) by mouth every 8 (eight) hours as needed. 10/14/18   Sharion Balloon, NP  neomycin-polymyxin b-dexamethasone (MAXITROL) 3.5-10000-0.1 SUSP Place 2 drops into the right eye every 6 (six) hours. 03/14/18   Raylene Everts, MD    Family History Family History  Problem Relation Age of Onset  . Heart failure Mother   . Gout Father     Social History Social History   Tobacco Use  . Smoking status: Current Every Day Smoker    Packs/day: 0.50    Types: Cigarettes  . Smokeless tobacco: Never Used  Substance Use Topics  . Alcohol use: Yes   Comment: socially  . Drug use: Yes    Types: Marijuana    Comment: few times a week     Allergies   Amoxicillin, Other, and Shellfish allergy   Review of Systems Review of Systems  Constitutional: Negative for chills and fever.  HENT: Negative for ear pain and sore throat.   Eyes: Negative for pain and visual disturbance.  Respiratory: Negative for cough and shortness of breath.   Cardiovascular: Negative for chest pain and palpitations.  Gastrointestinal: Negative for abdominal pain and vomiting.  Genitourinary: Negative for dysuria and hematuria.  Musculoskeletal: Positive for back pain. Negative for arthralgias.  Skin: Negative for color change and rash.  Neurological: Negative for seizures, syncope, weakness and numbness.  All other systems reviewed and are negative.    Physical Exam Triage Vital Signs ED Triage Vitals [10/14/18 0938]  Enc Vitals Group     BP 133/76     Pulse Rate (!) 101     Resp 16     Temp 98.9 F (37.2 C)     Temp Source Oral     SpO2 100 %     Weight      Height      Head Circumference      Peak Flow  Pain Score 8     Pain Loc      Pain Edu?      Excl. in GC?    No data found.  Updated Vital Signs BP 133/76 (BP Location: Left Arm)   Pulse (!) 101   Temp 98.9 F (37.2 C) (Oral)   Resp 16   SpO2 100%   Visual Acuity Right Eye Distance:   Left Eye Distance:   Bilateral Distance:    Right Eye Near:   Left Eye Near:    Bilateral Near:     Physical Exam Vitals signs and nursing note reviewed.  Constitutional:      Appearance: He is well-developed.  HENT:     Head: Normocephalic and atraumatic.  Eyes:     Conjunctiva/sclera: Conjunctivae normal.  Neck:     Musculoskeletal: Neck supple.  Cardiovascular:     Rate and Rhythm: Normal rate and regular rhythm.     Heart sounds: No murmur.  Pulmonary:     Effort: Pulmonary effort is normal. No respiratory distress.     Breath sounds: Normal breath sounds.  Abdominal:      Palpations: Abdomen is soft.     Tenderness: There is no abdominal tenderness.  Musculoskeletal: Normal range of motion.        General: No swelling, tenderness, deformity or signs of injury.     Right lower leg: No edema.     Left lower leg: No edema.  Skin:    General: Skin is warm and dry.     Capillary Refill: Capillary refill takes less than 2 seconds.     Findings: No bruising, erythema, lesion or rash.  Neurological:     Mental Status: He is alert.     Sensory: No sensory deficit.     Motor: No weakness.     Coordination: Coordination normal.     Gait: Gait normal.     Deep Tendon Reflexes: Reflexes normal.      UC Treatments / Results  Labs (all labs ordered are listed, but only abnormal results are displayed) Labs Reviewed - No data to display  EKG   Radiology No results found.  Procedures Procedures (including critical care time)  Medications Ordered in UC Medications - No data to display  Initial Impression / Assessment and Plan / UC Course  I have reviewed the triage vital signs and the nursing notes.  Pertinent labs & imaging results that were available during my care of the patient were reviewed by me and considered in my medical decision making (see chart for details).   Left lower back pain without sciatica.  Treating with ibuprofen and Flexeril as needed.  Precautions for Flexeril given to patient including not to drive, operate machinery, or drink alcohol with the medication.  Discussed with patient that he can follow-up with orthopedics if his pain continues or worsens.  Discussed that he should go to the emergency department if he has acute worsening pain, numbness or weakness in his legs, loss of control of his bowel or bladder, or other concerning symptoms.     Final Clinical Impressions(s) / UC Diagnoses   Final diagnoses:  Acute left-sided low back pain without sciatica     Discharge Instructions     Take the ibuprofen as needed for  your back pain.  You can take the muscle relaxer prescribed as needed but do not drive, operate machinery, or drink alcohol with this medication as it may make you drowsy.    Follow-up  with an orthopedic if your pain continues or worsens.    Go to the emergency department if you have acute worsening pain, numbness in your legs, loss of control of your bowel or bladder, weakness in your legs, or other concerning symptoms.        ED Prescriptions    Medication Sig Dispense Auth. Provider   ibuprofen (ADVIL) 800 MG tablet Take 1 tablet (800 mg total) by mouth every 8 (eight) hours as needed. 21 tablet Mickie Bailate, Daffney Greenly H, NP   cyclobenzaprine (FLEXERIL) 10 MG tablet Take 1 tablet (10 mg total) by mouth 2 (two) times daily as needed for muscle spasms. 20 tablet Mickie Bailate, Shalissa Easterwood H, NP     Controlled Substance Prescriptions [place] Controlled Substance Registry consulted? Not Applicable   Mickie Bailate, Frederic Tones H, NP 10/14/18 1019

## 2018-10-14 NOTE — Discharge Instructions (Addendum)
Take the ibuprofen as needed for your back pain.  You can take the muscle relaxer prescribed as needed but do not drive, operate machinery, or drink alcohol with this medication as it may make you drowsy.    Follow-up with an orthopedic if your pain continues or worsens.    Go to the emergency department if you have acute worsening pain, numbness in your legs, loss of control of your bowel or bladder, weakness in your legs, or other concerning symptoms.
